# Patient Record
Sex: Female | Born: 1995 | Race: Black or African American | Hispanic: No | Marital: Single | Smoking: Never smoker
Health system: Southern US, Community
[De-identification: ages and names within clinical notes are randomized; demographics above are authoritative.]

## PROBLEM LIST (undated history)

## (undated) DIAGNOSIS — J45909 Unspecified asthma, uncomplicated: Secondary | ICD-10-CM

---
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
## 2019-03-07 ENCOUNTER — Other Ambulatory Visit: Payer: Self-pay

## 2019-03-07 ENCOUNTER — Emergency Department (HOSPITAL_COMMUNITY)
Admission: EM | Admit: 2019-03-07 | Discharge: 2019-03-08 | Disposition: A | Discharge status: Home / Self Care | Payer: Medicaid Other | Attending: Emergency Medicine | Admitting: Emergency Medicine

## 2019-03-07 ENCOUNTER — Encounter (HOSPITAL_COMMUNITY): Payer: Self-pay

## 2019-03-07 DIAGNOSIS — J45909 Unspecified asthma, uncomplicated: Secondary | ICD-10-CM | POA: Diagnosis not present

## 2019-03-07 DIAGNOSIS — Z79899 Other long term (current) drug therapy: Secondary | ICD-10-CM | POA: Diagnosis not present

## 2019-03-07 DIAGNOSIS — H5713 Ocular pain, bilateral: Secondary | ICD-10-CM | POA: Diagnosis present

## 2019-03-07 DIAGNOSIS — H1033 Unspecified acute conjunctivitis, bilateral: Secondary | ICD-10-CM

## 2019-03-07 MED ORDER — TETRACAINE HCL 0.5 % OP SOLN
1.0000 [drp] | Freq: Once | OPHTHALMIC | Status: AC
  Administered 2019-03-08: 2 [drp] via OPHTHALMIC
  Filled 2019-03-07: qty 4

## 2019-03-07 MED ORDER — FLUORESCEIN SODIUM 1 MG OP STRP
1.0000 | ORAL_STRIP | Freq: Once | OPHTHALMIC | Status: DC
  Filled 2019-03-07: qty 1

## 2019-03-07 NOTE — ED Notes (Signed)
PT CURRENTLY TEXTING ON PHONE

## 2019-03-07 NOTE — ED Triage Notes (Signed)
Pt reports 3 days of eye swelling and discomfort. States that when she wakes up, they are crusted closed. She denies any cold symptoms. Given drops at [facility] yesterday without improvement. A&Ox4.

## 2019-03-08 MED ORDER — ERYTHROMYCIN 5 MG/GM OP OINT
TOPICAL_OINTMENT | Freq: Once | OPHTHALMIC | Status: AC
Start: 2019-03-08 — End: 2019-03-08
  Administered 2019-03-08: 1 via OPHTHALMIC
  Filled 2019-03-08: qty 3.5

## 2019-03-08 NOTE — ED Provider Notes (Signed)
[facility]-EMERGENCY DEPT Provider Note   CSN: [id number] Arrival date & time: 03/07/19  1947     History   Chief Complaint Chief Complaint  Patient presents with   Eye Pain    HPI Doris Bridges is a 23 y.o. female.     Patient presents the emergency department with complaint of 3 days of eye redness, irritation, drainage, and crusting.  Patient denies any sick contacts.  She does not have any eye pain.  No loss of vision.  Blurry vision in the morning because of the crusting improved after wiping her eyes.  No fevers or cold symptoms.  Patient was seen in urgent care yesterday and given drops.  She is not sure what the name of the drops were.  She states that this has helped with the redness but not with the crusting.  No nausea or vomiting.  She noted a mild rash around her eyes today.  Onset of symptoms acute.  She does not wear contact lenses.  She denies any eye injuries.     Past Medical History:  Diagnosis Date   Asthma     There are no active problems to display for this patient.   History reviewed. No pertinent surgical history.   OB History   No obstetric history on file.      Home Medications    Prior to Admission medications   Medication Sig Start Date End Date Taking? Authorizing Provider  cetirizine (ZYRTEC) 10 MG tablet Take 10 mg by mouth daily as needed for allergies.    Yes [provider]  ibuprofen (ADVIL) 800 MG tablet Take 800 mg by mouth every 8 (eight) hours as needed for moderate pain.  03/05/19  Yes [provider]  Norgestimate-Ethinyl Estradiol Triphasic (TRI-LO-SPRINTEC) 0.18/0.215/0.25 MG-25 MCG tab Take 1 tablet by mouth daily.  01/10/19  Yes [provider]  trimethoprim-polymyxin b (POLYTRIM) ophthalmic solution Place 1 drop into both eyes every 4 (four) hours.  03/05/19 03/12/19 Yes [provider]    Family History History reviewed. No pertinent family history.  Social  History Social History   Tobacco Use   Smoking status: Never Smoker   Smokeless tobacco: Never Used  Substance Use Topics   Alcohol use: No   Drug use: Not on file     Allergies   Patient has no known allergies.   Review of Systems Review of Systems  Constitutional: Negative for fever.  Eyes: Positive for discharge, redness and itching. Negative for photophobia, pain and visual disturbance.  Neurological: Negative for headaches.     Physical Exam Updated Vital Signs BP (!) 142/73    Pulse 87    Temp 99.2 F (37.3 C) (Oral)    Resp 18    Ht 5\' 3"  (1.6 m)    Wt 124.7 kg    SpO2 94%    BMI 48.71 kg/m   Physical Exam Vitals signs and nursing note reviewed.  Constitutional:      Appearance: She is well-developed.  HENT:     Head: Normocephalic and atraumatic.  Eyes:     Extraocular Movements: Extraocular movements intact.     Conjunctiva/sclera:     Right eye: Right conjunctiva is injected. Exudate present. No chemosis or hemorrhage.    Left eye: Left conjunctiva is injected. Exudate present. No chemosis or hemorrhage.    Comments: Patient with a slight purulent discharge to the bilateral eyes.  There is a mild dermatitis-like rash around both eyes.  No  signs of periorbital cellulitis.  Conjunctiva mildly inflamed.   Neck:     Musculoskeletal: Normal range of motion and neck supple.  Pulmonary:     Effort: No respiratory distress.  Skin:    General: Skin is warm and dry.  Neurological:     Mental Status: She is alert.      ED Treatments / Results  Labs (all labs ordered are listed, but only abnormal results are displayed) Labs Reviewed - No data to display  EKG None  Radiology No results found.  Procedures Procedures (including critical care time)  Medications Ordered in ED Medications  fluorescein ophthalmic strip 1 strip (has no administration in time range)  tetracaine (PONTOCAINE) 0.5 % ophthalmic solution 1-2 drop (2 drops Both Eyes Given  03/08/19 0037)  erythromycin ophthalmic ointment (1 application Both Eyes Given 03/08/19 0037)     Initial Impression / Assessment and Plan / ED Course  I have reviewed the triage vital signs and the nursing notes.  Pertinent labs & imaging results that were available during my care of the patient were reviewed by me and considered in my medical decision making (see chart for details).        Patient seen and examined.    Vital signs reviewed and are as follows: BP (!) 142/73    Pulse 87    Temp 99.2 F (37.3 C) (Oral)    Resp 18    Ht 5\' 3"  (1.6 m)    Wt 124.7 kg    SpO2 94%    BMI 48.71 kg/m     Visual Acuity  Right Eye Distance: 20/50 Left Eye Distance: 20/50 Bilateral Distance: 20/50(Visual acuity screening completed without contacts or glasses as the patient is not prescribed for any.)  Right Eye Near:   Left Eye Near:    Bilateral Near:     Patient with clinical bilateral conjunctivitis.  Will change from drops to erythromycin ointment.  Instructed on its use.  Patient instructed to use Benadryl if she has any itching rash around her eye.  Encouraged return to the emergency department with change or loss of vision, fevers, pain around the eyes, difficulty with movement of her eyes.  She verbalizes understanding agrees with plan.   Final Clinical Impressions(s) / ED Diagnoses   Final diagnoses:  Acute conjunctivitis of both eyes, unspecified acute conjunctivitis type   Patient with drainage and crusting of her bilateral eyes for the past several days.  Exam is consistent with bacterial conjunctivitis.  No foreign bodies noted. No surrounding erythema, swelling, vision changes/loss suspicious for orbital or periorbital cellulitis. No signs of iritis. No signs of glaucoma. No symptoms of retinal detachment. No ophthalmologic emergency suspected.   ED Discharge Orders    None       Carlisle Cater, PA-C 03/08/19 8101    Varney Biles, MD 03/08/19 [phone]

## 2019-03-08 NOTE — Discharge Instructions (Signed)
Please read and follow all provided instructions.  Your diagnoses today include:  1. Acute conjunctivitis of both eyes, unspecified acute conjunctivitis type     Tests performed today include:  Visual acuity testing to check your vision  Vital signs. See below for your results today.   Medications prescribed:   Erythromycin  - antibiotic eye ointment  Use this medication as follows:  Apply 1/4" of the antibiotic ointment to affected eye up to 6 times a day while awake for 7 days  Take any prescribed medications only as directed.  Home care instructions:  Follow any educational materials contained in this packet. If you wear contact lenses, do not use them until your eye caregiver approves. Follow-up care is necessary to be sure the infection is healing if not completely resolved in 2-3 days. See your caregiver or eye specialist as suggested for followup.   If you have an eye infection, wash your hands often as this is very contagious and is easily spread from person to person.   Follow-up instructions: Please follow-up with your primary care doctor in the next 2-3 days for further evaluation of your symptoms.  Return instructions:   Please return to the Emergency Department if you experience worsening symptoms.   Please return immediately if you develop severe pain, pus drainage, new change in vision, or fever.  Please return if you have any other emergent concerns.  Additional Information:  Your vital signs today were: BP (!) 160/81    Pulse 89    Temp 99.2 F (37.3 C) (Oral)    Resp 17    Ht 5\' 3"  (1.6 m)    Wt 124.7 kg    SpO2 99%    BMI 48.71 kg/m  If your blood pressure (BP) was elevated above 135/85 this visit, please have this repeated by your doctor within one month. ---------------

## 2019-03-08 NOTE — ED Notes (Signed)
Pt was verbalized discharge instructions. Pt had no further questions at this time. NAD. 

## 2019-06-16 ENCOUNTER — Ambulatory Visit: Payer: Medicaid Other | Attending: Internal Medicine

## 2019-06-16 DIAGNOSIS — Z20822 Contact with and (suspected) exposure to covid-19: Secondary | ICD-10-CM

## 2019-06-17 LAB — NOVEL CORONAVIRUS, NAA: SARS-CoV-2, NAA: NOT DETECTED

## 2019-06-30 ENCOUNTER — Ambulatory Visit: Payer: Medicaid Other | Attending: Internal Medicine

## 2019-06-30 DIAGNOSIS — Z20822 Contact with and (suspected) exposure to covid-19: Secondary | ICD-10-CM

## 2019-07-01 LAB — NOVEL CORONAVIRUS, NAA: SARS-CoV-2, NAA: NOT DETECTED

## 2020-04-06 ENCOUNTER — Emergency Department (HOSPITAL_COMMUNITY)
Admission: EM | Admit: 2020-04-06 | Discharge: 2020-04-06 | Disposition: A | Discharge status: Home / Self Care | Payer: Medicaid Other | Attending: Emergency Medicine | Admitting: Emergency Medicine

## 2020-04-06 ENCOUNTER — Encounter (HOSPITAL_COMMUNITY): Payer: Self-pay

## 2020-04-06 DIAGNOSIS — R0789 Other chest pain: Secondary | ICD-10-CM

## 2020-04-06 DIAGNOSIS — J45909 Unspecified asthma, uncomplicated: Secondary | ICD-10-CM | POA: Diagnosis not present

## 2020-04-06 DIAGNOSIS — R519 Headache, unspecified: Secondary | ICD-10-CM | POA: Diagnosis not present

## 2020-04-06 DIAGNOSIS — R0602 Shortness of breath: Secondary | ICD-10-CM | POA: Diagnosis not present

## 2020-04-06 NOTE — ED Triage Notes (Signed)
CP/SOB and HA x 4 days. Worse today. Denies cardiac hx. Denies fever.

## 2020-04-06 NOTE — ED Provider Notes (Signed)
[facility]-EMERGENCY DEPT Provider Note   CSN: [id number] Arrival date & time: 04/06/20  1008     History Chief Complaint  Patient presents with  . Chest Pain    Doris Bridges is a 24 y.o. female.  The history is provided by the patient. No language interpreter was used.  Chest Pain    24 year old female with history of asthma presenting complaining of chest pressure.  Patient report for the past 3 to 4 days she has been having throbbing headache described as a bandlike sensation across her head and as well as having chest pressure.  Pressure is rated as 4 out of 10, persistent, with mild shortness of breath but more when she tries to take a deep breath.  No associated fever or chills no vision changes no runny nose sneezing or coughing no loss of taste or smell no nausea vomiting diarrhea or dysuria.  She denies any focal numbness or focal weakness.  She was having trouble sleeping last night and felt a bit fatigued today.  Sister does have complaints of some congestion but patient denies any other URI symptoms.  She did try taking NyQuil and Tylenol for symptoms.  She has not been vaccinated for COVID-19.  She does have history of anxiety depression but denies having any increased stress.  She denies any prior history of PE or DVT.  She does have family history of cardiac disease but no premature cardiac death.     Past Medical History:  Diagnosis Date  . Asthma     There are no problems to display for this patient.   History reviewed. No pertinent surgical history.   OB History   No obstetric history on file.     History reviewed. No pertinent family history.  Social History   Tobacco Use  . Smoking status: Never Smoker  . Smokeless tobacco: Never Used  Substance Use Topics  . Alcohol use: No  . Drug use: Not on file    Home Medications Prior to Admission medications   Medication Sig Start Date End Date Taking? Authorizing Provider    ibuprofen (ADVIL) 800 MG tablet Take 800 mg by mouth every 8 (eight) hours as needed for moderate pain.  03/05/19  Yes [provider]  predniSONE (DELTASONE) 20 MG tablet Take 40 mg by mouth daily. 03/30/20  Yes [provider]    Allergies    Patient has no known allergies.  Review of Systems   Review of Systems  Cardiovascular: Positive for chest pain.  All other systems reviewed and are negative.   Physical Exam Updated Vital Signs BP 136/79 (BP Location: Right Arm)   Pulse 63   Temp 98.2 F (36.8 C) (Oral)   SpO2 100%   Physical Exam Vitals and nursing note reviewed.  Constitutional:      General: She is not in acute distress.    Appearance: She is well-developed. She is obese.  HENT:     Head: Atraumatic.  Eyes:     Extraocular Movements: Extraocular movements intact.     Conjunctiva/sclera: Conjunctivae normal.     Pupils: Pupils are equal, round, and reactive to light.  Cardiovascular:     Rate and Rhythm: Normal rate and regular rhythm.     Heart sounds: Normal heart sounds.  Pulmonary:     Effort: Pulmonary effort is normal.     Breath sounds: Normal breath sounds.     Comments: No chest wall tenderness.  Lung sounds clear  to auscultation bilaterally. Chest:     Chest wall: No tenderness.  Abdominal:     Palpations: Abdomen is soft.     Tenderness: There is no abdominal tenderness.  Musculoskeletal:     Cervical back: Normal range of motion and neck supple.     Comments: 5 out of 5 strength all 4 extremities.  Lymphadenopathy:     Cervical: No cervical adenopathy.  Skin:    Findings: No rash.  Neurological:     Mental Status: She is alert and oriented to person, place, and time.     GCS: GCS eye subscore is 4. GCS verbal subscore is 5. GCS motor subscore is 6.     Cranial Nerves: Cranial nerves are intact.     Sensory: Sensation is intact.     Motor: Motor function is intact.     ED Results / Procedures / Treatments    Labs (all labs ordered are listed, but only abnormal results are displayed) Labs Reviewed - No data to display  EKG None  Radiology No results found.  Procedures Procedures (including critical care time)  Medications Ordered in ED Medications - No data to display  ED Course  I have reviewed the triage vital signs and the nursing notes.  Pertinent labs & imaging results that were available during my care of the patient were reviewed by me and considered in my medical decision making (see chart for details).    MDM Rules/Calculators/A&P                          BP 136/79 (BP Location: Right Arm)   Pulse 63   Temp 98.2 F (36.8 C) (Oral)   SpO2 100%   Final Clinical Impression(s) / ED Diagnoses Final diagnoses:  Atypical chest pain    Rx / DC Orders ED Discharge Orders    None     11:12 AM Patient here complains of headache and chest pain.  Symptom is mild on presentation.  No reproducible pain on exam.  Pain is atypical of ACS.  Headache without any red flags.  Patient is PERC negative, doubt PE.  EKG reviewed by me and unremarkable.  Through shared decision making, we do not think advanced imaging or additional work-up is indicated at this time.  She has not been vaccinated for COVID-19 but is made aware that if she does develop cold symptoms to get tested.  Otherwise at this time patient stable for discharge.  O2 sats at 100% and vital signs stable.  No wheezing on exam.  Remote history of asthma when she was young but has not needed to use inhaler for many years.  Return precaution discussed.   Fayrene Helper, PA-C 04/06/20 1127    Sabas Sous, MD 04/06/20 [phone]

## 2020-04-06 NOTE — Discharge Instructions (Signed)
Take over-the-counter Tylenol or ibuprofen as needed for your chest discomfort and headache.  If you develop runny nose sneezing coughing sore throat fever or chills you may consider getting tested for COVID-19.  Return to the ER if your condition worsen, if you have significant shortness of breath or if you have any concern

## 2020-11-01 ENCOUNTER — Emergency Department (HOSPITAL_BASED_OUTPATIENT_CLINIC_OR_DEPARTMENT_OTHER): Payer: Medicaid Other

## 2020-11-01 ENCOUNTER — Emergency Department (HOSPITAL_BASED_OUTPATIENT_CLINIC_OR_DEPARTMENT_OTHER)
Admission: EM | Admit: 2020-11-01 | Discharge: 2020-11-01 | Disposition: A | Discharge status: Home / Self Care | Payer: Medicaid Other | Attending: Emergency Medicine | Admitting: Emergency Medicine

## 2020-11-01 ENCOUNTER — Other Ambulatory Visit: Payer: Self-pay

## 2020-11-01 ENCOUNTER — Encounter (HOSPITAL_BASED_OUTPATIENT_CLINIC_OR_DEPARTMENT_OTHER): Payer: Self-pay | Admitting: Obstetrics and Gynecology

## 2020-11-01 DIAGNOSIS — J45909 Unspecified asthma, uncomplicated: Secondary | ICD-10-CM | POA: Diagnosis not present

## 2020-11-01 DIAGNOSIS — U071 COVID-19: Secondary | ICD-10-CM | POA: Insufficient documentation

## 2020-11-01 DIAGNOSIS — R0981 Nasal congestion: Secondary | ICD-10-CM | POA: Diagnosis present

## 2020-11-01 LAB — COMPREHENSIVE METABOLIC PANEL
ALT: 28 U/L (ref 0–44)
AST: 27 U/L (ref 15–41)
Albumin: 3.6 g/dL (ref 3.5–5.0)
Alkaline Phosphatase: 58 U/L (ref 38–126)
Anion gap: 9 (ref 5–15)
BUN: 8 mg/dL (ref 6–20)
CO2: 24 mmol/L (ref 22–32)
Calcium: 8.6 mg/dL — ABNORMAL LOW (ref 8.9–10.3)
Chloride: 105 mmol/L (ref 98–111)
Creatinine, Ser: 0.83 mg/dL (ref 0.44–1.00)
GFR, Estimated: >60 mL/min (ref >60)
Glucose, Bld: 152 mg/dL — ABNORMAL HIGH (ref 70–99)
Potassium: 3.5 mmol/L (ref 3.5–5.1)
Sodium: 138 mmol/L (ref 135–145)
Total Bilirubin: 0.2 mg/dL — ABNORMAL LOW (ref 0.3–1.2)
Total Protein: 6.4 g/dL — ABNORMAL LOW (ref 6.5–8.1)

## 2020-11-01 LAB — CBC WITH DIFFERENTIAL/PLATELET
Abs Immature Granulocytes: 0.04 10*3/uL (ref 0.00–0.07)
Basophils Absolute: 0 10*3/uL (ref 0.0–0.1)
Basophils Relative: 0 %
Eosinophils Absolute: 0.2 10*3/uL (ref 0.0–0.5)
Eosinophils Relative: 3 %
HCT: 36.7 % (ref 36.0–46.0)
Hemoglobin: 11.4 g/dL — ABNORMAL LOW (ref 12.0–15.0)
Immature Granulocytes: 1 %
Lymphocytes Relative: 5 %
Lymphs Abs: 0.3 10*3/uL — ABNORMAL LOW (ref 0.7–4.0)
MCH: 24.2 pg — ABNORMAL LOW (ref 26.0–34.0)
MCHC: 31.1 g/dL (ref 30.0–36.0)
MCV: 77.8 fL — ABNORMAL LOW (ref 80.0–100.0)
Monocytes Absolute: 0.3 10*3/uL (ref 0.1–1.0)
Monocytes Relative: 6 %
Neutro Abs: 5.3 10*3/uL (ref 1.7–7.7)
Neutrophils Relative %: 85 %
Platelets: 394 10*3/uL (ref 150–400)
RBC: 4.72 MIL/uL (ref 3.87–5.11)
RDW: 15.9 % — ABNORMAL HIGH (ref 11.5–15.5)
WBC: 6.2 10*3/uL (ref 4.0–10.5)
nRBC: 0 % (ref 0.0–0.2)

## 2020-11-01 LAB — URINALYSIS, ROUTINE W REFLEX MICROSCOPIC
Bilirubin Urine: NEGATIVE
Glucose, UA: NEGATIVE mg/dL
Hgb urine dipstick: NEGATIVE
Ketones, ur: NEGATIVE mg/dL
Leukocytes,Ua: NEGATIVE
Nitrite: NEGATIVE
Protein, ur: NEGATIVE mg/dL
Specific Gravity, Urine: 1.01 (ref 1.005–1.030)
pH: 6.5 (ref 5.0–8.0)

## 2020-11-01 LAB — RESP PANEL BY RT-PCR (FLU A&B, COVID) ARPGX2
Influenza A by PCR: NEGATIVE
Influenza B by PCR: NEGATIVE
SARS Coronavirus 2 by RT PCR: POSITIVE — AB

## 2020-11-01 MED ORDER — KETOROLAC TROMETHAMINE 30 MG/ML IJ SOLN
30.0000 mg | Freq: Once | INTRAMUSCULAR | Status: AC
  Administered 2020-11-01: 30 mg via INTRAVENOUS
  Filled 2020-11-01: qty 1

## 2020-11-01 MED ORDER — PAXLOVID 20 X 150 MG & 10 X 100MG PO TBPK: 1.0000 | ORAL_TABLET | Freq: Two times a day (BID) | ORAL | 0 refills | Status: AC

## 2020-11-01 MED ORDER — SODIUM CHLORIDE 0.9 % IV BOLUS
1000.0000 mL | Freq: Once | INTRAVENOUS | Status: AC
  Administered 2020-11-01: 1000 mL via INTRAVENOUS

## 2020-11-01 NOTE — Discharge Instructions (Signed)
You need to start the medication today. If your symptoms rebound after you finish the medication, you must quarantine again.

## 2020-11-01 NOTE — ED Triage Notes (Signed)
Patient reports nausea, headaches and chills. Denies sick exposures. Patient endorses vaccination against COVID 19. Patient denies pregnancy.

## 2020-11-01 NOTE — ED Notes (Signed)
Spoke with the patient and she stated that she tested herself this morning for Covid and the result is inconclusive.  She stated that the line for the covid test is faded and not clear.  They arrived from the [place] this past Sunday and was not feeling good.  Her mother is also here for the same complaint.

## 2020-11-01 NOTE — ED Provider Notes (Signed)
[facility] EMERGENCY DEPT Provider Note   CSN: [id number] Arrival date & time: 11/01/20  1535     History Chief Complaint  Patient presents with  . Headache    Doris Bridges is a 25 y.o. female.  The history is provided by the patient.  URI Presenting symptoms: congestion, cough and sore throat   Presenting symptoms: no ear pain and no fever   Severity:  Moderate Onset quality:  Gradual Duration:  1 day Timing:  Intermittent Progression:  Unchanged Chronicity:  New Relieved by:  Nothing Worsened by:  Nothing Ineffective treatments:  None tried Associated symptoms: headaches   Associated symptoms: no arthralgias   Risk factors: recent travel   Risk factors comment:  Just went on a cruise to the [place]; vaccinated for COVID x 2 but not boosted      Past Medical History:  Diagnosis Date  . Asthma     There are no problems to display for this patient.   History reviewed. No pertinent surgical history.   OB History    Gravida      Para      Term      Preterm      AB      Living  0     SAB      IAB      Ectopic      Multiple      Live Births              No family history on file.  Social History   Tobacco Use  . Smoking status: Never Smoker  . Smokeless tobacco: Never Used  Vaping Use  . Vaping Use: Every day  . Substances: Nicotine, Flavoring  Substance Use Topics  . Alcohol use: No  . Drug use: Not Currently    Home Medications Prior to Admission medications   Medication Sig Start Date End Date Taking? Authorizing Provider  ibuprofen (ADVIL) 800 MG tablet Take 800 mg by mouth every 8 (eight) hours as needed for moderate pain.  03/05/19  Yes [provider]  predniSONE (DELTASONE) 20 MG tablet Take 40 mg by mouth daily. 03/30/20   [provider]    Allergies    Patient has no known allergies.  Review of Systems   Review of Systems  Constitutional: Negative for chills and fever.   HENT: Positive for congestion and sore throat. Negative for ear pain.   Eyes: Negative for pain and visual disturbance.  Respiratory: Positive for cough. Negative for shortness of breath.   Cardiovascular: Negative for chest pain and palpitations.  Gastrointestinal: Positive for nausea. Negative for abdominal pain and vomiting.  Genitourinary: Negative for dysuria and hematuria.  Musculoskeletal: Negative for arthralgias and back pain.  Skin: Negative for color change and rash.  Neurological: Positive for headaches. Negative for seizures and syncope.  All other systems reviewed and are negative.   Physical Exam Updated Vital Signs BP 130/88   Pulse (!) 145   Temp 99.3 F (37.4 C)   Resp 17   Ht 5\' 3"  (1.6 m)   Wt 131.5 kg   LMP 10/25/2020   SpO2 94%   BMI 51.37 kg/m   Physical Exam Vitals and nursing note reviewed.  HENT:     Head: Normocephalic and atraumatic.  Eyes:     General: No scleral icterus. Pulmonary:     Effort: Pulmonary effort is normal. No respiratory distress.  Musculoskeletal:     Cervical back: Normal range  of motion.  Skin:    General: Skin is warm and dry.  Neurological:     Mental Status: She is alert.  Psychiatric:        Mood and Affect: Mood normal.     ED Results / Procedures / Treatments   Labs (all labs ordered are listed, but only abnormal results are displayed) Labs Reviewed  RESP PANEL BY RT-PCR (FLU A&B, COVID) ARPGX2 - Abnormal; Notable for the following components:      Result Value   SARS Coronavirus 2 by RT PCR POSITIVE (*)    All other components within normal limits  CBC WITH DIFFERENTIAL/PLATELET - Abnormal; Notable for the following components:   Hemoglobin 11.4 (*)    MCV 77.8 (*)    MCH 24.2 (*)    RDW 15.9 (*)    Lymphs Abs 0.3 (*)    All other components within normal limits  COMPREHENSIVE METABOLIC PANEL - Abnormal; Notable for the following components:   Glucose, Bld 152 (*)    Calcium 8.6 (*)    Total  Protein 6.4 (*)    Total Bilirubin 0.2 (*)    All other components within normal limits  URINALYSIS, ROUTINE W REFLEX MICROSCOPIC    EKG EKG Interpretation  Date/Time:  Monday Nov 01 2020 15:47:20 EDT Ventricular Rate:  125 PR Interval:  132 QRS Duration: 86 QT Interval:  314 QTC Calculation: 453 R Axis:   66 Text Interpretation: Sinus tachycardia Nonspecific T abnormalities, diffuse leads Baseline wander in lead(s) V2 axis normal No acute ischemia Confirmed by Pieter Partridge ([id number]) on 11/01/2020 3:49:47 PM   Radiology DG Chest Portable 1 View  Result Date: 11/01/2020 CLINICAL DATA:  Chest pain.  Coughing. EXAM: PORTABLE CHEST 1 VIEW COMPARISON:  None. FINDINGS: Cardiomediastinal silhouette is normal. Mediastinal contours appear intact. Minimal ground-glass peribronchial opacities with lower lobe predominance. Osseous structures are without acute abnormality. Soft tissues are grossly normal. IMPRESSION: Minimal ground-glass peribronchial opacities with lower lobe predominance may represent atypical/viral pneumonia. Electronically Signed   By: Ted Mcalpine M.D.   On: 11/01/2020 16:24    Procedures Procedures   Medications Ordered in ED Medications  sodium chloride 0.9 % bolus 1,000 mL (0 mLs Intravenous Stopped 11/01/20 1736)  ketorolac (TORADOL) 30 MG/ML injection 30 mg (30 mg Intravenous Given 11/01/20 1626)    ED Course  I have reviewed the triage vital signs and the nursing notes.  Pertinent labs & imaging results that were available during my care of the patient were reviewed by me and considered in my medical decision making (see chart for details).    MDM Rules/Calculators/A&P                          Doris Bridges presents with viral symptoms.  She just returned from a cruise in the [place].  She was noted to be tachycardic.  She was given IV fluids.  She was given symptomatic treatment for her headache and nausea.  COVID test was positive.  I talked to her about  PACs lipid.  Her main risk factor is her body mass index.  We are already seeing pneumonia on chest x-ray, and I think it would be a good idea for her to start this medication.  We talked about its appropriate use including the need to start it immediately.  We also talked about the rebound phenomenon that could occur.  She was advised on other return precautions. Final Clinical Impression(s) /  ED Diagnoses Final diagnoses:  COVID-19    Rx / DC Orders ED Discharge Orders         Ordered    Nirmatrelvir & Ritonavir (PAXLOVID) 20 x 150 MG & 10 x 100MG  TBPK  2 times daily        11/01/20 1750           11/03/20, MD 11/01/20 1752

## 2022-01-18 ENCOUNTER — Emergency Department (HOSPITAL_BASED_OUTPATIENT_CLINIC_OR_DEPARTMENT_OTHER): Payer: Medicaid Other | Admitting: Radiology

## 2022-01-18 ENCOUNTER — Encounter (HOSPITAL_BASED_OUTPATIENT_CLINIC_OR_DEPARTMENT_OTHER): Payer: Self-pay

## 2022-01-18 ENCOUNTER — Emergency Department (HOSPITAL_BASED_OUTPATIENT_CLINIC_OR_DEPARTMENT_OTHER)
Admission: EM | Admit: 2022-01-18 | Discharge: 2022-01-18 | Disposition: A | Discharge status: Home / Self Care | Payer: Medicaid Other | Attending: Emergency Medicine | Admitting: Emergency Medicine

## 2022-01-18 DIAGNOSIS — M25571 Pain in right ankle and joints of right foot: Secondary | ICD-10-CM | POA: Diagnosis present

## 2022-01-18 MED ORDER — ACETAMINOPHEN 325 MG PO TABS
ORAL_TABLET | ORAL | Status: AC
  Filled 2022-01-18: qty 1

## 2022-01-18 MED ORDER — ACETAMINOPHEN 325 MG PO TABS
650.0000 mg | ORAL_TABLET | ORAL | Status: AC
  Administered 2022-01-18: 650 mg via ORAL
  Filled 2022-01-18: qty 2

## 2022-01-18 NOTE — ED Notes (Signed)
Discharge paperwork given and verbally understood. 

## 2022-01-18 NOTE — ED Triage Notes (Signed)
She states she "tripped" and thereby hurt her right ankle. She is in no distress.

## 2022-01-18 NOTE — ED Provider Notes (Signed)
[facility] EMERGENCY DEPT Provider Note   CSN: [id number] Arrival date & time: 01/18/22  1251     History Chief Complaint  Patient presents with   Ankle Pain    Doris Bridges is a 26 y.o. female otherwise healthy presents to the ED for evaluation of her right ankle pain after stepping off a step wrong yesterday. She reports that last week she rolled her ankle in an uncomfortable pair of shoes as well. She denies any numbness, tingling, fevers, or weakness. No pain medications trialed. Denies any daily medications or allergies to medications. LMP one week ago.    Ankle Pain Associated symptoms: no back pain and no fever        Home Medications Prior to Admission medications   Medication Sig Start Date End Date Taking? Authorizing Provider  ibuprofen (ADVIL) 800 MG tablet Take 800 mg by mouth every 8 (eight) hours as needed for moderate pain.  03/05/19   [provider]  predniSONE (DELTASONE) 20 MG tablet Take 40 mg by mouth daily. 03/30/20   [provider]      Allergies    Patient has no known allergies.    Review of Systems   Review of Systems  Constitutional:  Negative for chills and fever.  Musculoskeletal:  Positive for arthralgias. Negative for back pain.  Neurological:  Negative for weakness and numbness.    Physical Exam Updated Vital Signs BP (!) 104/58 (BP Location: Right Arm)   Pulse 73   Temp 97.8 F (36.6 C) (Oral)   Resp 16   LMP 01/11/2022 (Exact Date)   SpO2 100%  Physical Exam Vitals and nursing note reviewed.  Constitutional:      Appearance: Normal appearance.  Eyes:     General: No scleral icterus. Pulmonary:     Effort: Pulmonary effort is normal. No respiratory distress.  Musculoskeletal:        General: Tenderness present.     Comments: RLE - Coloration and temperature appear in feel symmetric.  Palpable DP and PT pulses.  Compartments are soft.  Full range of motion of ankle with and without  resistance, but with some pain. Tenderness to the inferior lateral malleous.  Cap refill brisk. No swelling noted.   Skin:    General: Skin is dry.     Findings: No rash.  Neurological:     General: No focal deficit present.     Mental Status: She is alert. Mental status is at baseline.  Psychiatric:        Mood and Affect: Mood normal.     ED Results / Procedures / Treatments   Labs (all labs ordered are listed, but only abnormal results are displayed) Labs Reviewed - No data to display  EKG None  Radiology DG Ankle Complete Right  Result Date: 01/18/2022 CLINICAL DATA:  Fall yesterday.  Ankle pain EXAM: RIGHT ANKLE - COMPLETE 3+ VIEW COMPARISON:  None Available. FINDINGS: There is no evidence of fracture, dislocation, or joint effusion. There is no evidence of arthropathy or other focal bone abnormality. Soft tissues are unremarkable. IMPRESSION: Negative. Electronically Signed   By: Marlan Palau M.D.   On: 01/18/2022 13:35    Procedures Procedures    Medications Ordered in ED Medications - No data to display  ED Course/ Medical Decision Making/ A&P                           Medical Decision Making Amount  and/or Complexity of Data Reviewed Radiology: ordered.    26 year old female presents the emergency room for evaluation of ankle pain after stepping off a step wrong and also while rolling her ankle last week.  Differential diagnosis includes but is not limited to septic arthritis, sprain, strain, dislocation, fracture, MSK.  Vital signs are unremarkable.  Physical exam as noted above.  X-ray right ankle shows no evidence of any fracture, dislocation, joint effusion.  There is no evidence of any arthropathy or any other focal bone abnormality.  Soft tissue is unremarkable.  Doubt any septic arthritis given the normal vital signs, injury, and lack of swelling or overlying erythema.  Patient is also weightbearing and has full range of motion.  No dislocation or  fracture seen of the x-ray of the ankle.  We will give the patient some Tylenol while here as I do not know her pregnancy status although she did have LMP last week ago.  Patient is okay with this.  We will place patient in a ankle brace and have her follow-up with sports medicine.  Discussed taking Tylenol or ibuprofen as needed for pain.  We discussed the RICE method.  We discussed return precautions and red flag symptoms.  Patient verbalized understanding and agrees to the plan.  Patient is stable and being discharged home in good condition.  Final Clinical Impression(s) / ED Diagnoses Final diagnoses:  Acute right ankle pain    Rx / DC Orders ED Discharge Orders     None         Achille Rich, PA-C 01/18/22 1702    Charlynne Pander, MD 01/20/22 [phone]

## 2022-01-18 NOTE — Discharge Instructions (Signed)
You were seen in the emergency department for evaluation of your ankle pain.  Your x-ray was unremarkable.  You may have some tendon or ligament injury or damage.  I have placed you in a brace to wear when you are up walking.  You can take this off when you are sleeping or at rest.  I have included information on the RICE method as well.  Please read.  Can take Tylenol 1000 mg and/or ibuprofen 600mg  every 6 hours as needed for pain.  Please do not take ibuprofen if concern for pregnancy.  I have included information for a sports medicine provider for you to call to schedule an appointment with.  If you have any concerns, new or worsening symptoms, please return to the nearest emergency department for evaluation.  Contact a health care provider if: Your pain gets worse. Your pain is not relieved with medicines. You have a fever or chills. You are having more trouble with walking. You have new symptoms. Get help right away if: Your foot, leg, toes, or ankle: Tingles or becomes numb. Becomes swollen. Turns pale or blue.

## 2022-01-18 NOTE — ED Notes (Signed)
Pt still had PMS in foot after brace applied. No additional pain.

## 2022-01-18 NOTE — ED Notes (Signed)
Third Tylenol 325mg  pulled from the pixus, Pt dropped one of the original tablets.

## 2022-02-01 ENCOUNTER — Ambulatory Visit: Payer: Medicaid Other | Admitting: Family Medicine

## 2022-02-08 ENCOUNTER — Ambulatory Visit (HOSPITAL_BASED_OUTPATIENT_CLINIC_OR_DEPARTMENT_OTHER)
Admission: RE | Admit: 2022-02-08 | Discharge: 2022-02-08 | Disposition: A | Discharge status: Home / Self Care | Payer: Medicaid Other | Source: Ambulatory Visit | Attending: Family Medicine | Admitting: Family Medicine

## 2022-02-08 ENCOUNTER — Ambulatory Visit (INDEPENDENT_AMBULATORY_CARE_PROVIDER_SITE_OTHER): Payer: Medicaid Other | Admitting: Family Medicine

## 2022-02-08 ENCOUNTER — Ambulatory Visit: Payer: Self-pay

## 2022-02-08 VITALS — BP 152/84 | Ht 63.0 in | Wt 280.0 lb

## 2022-02-08 DIAGNOSIS — M79671 Pain in right foot: Secondary | ICD-10-CM

## 2022-02-08 DIAGNOSIS — S92214A Nondisplaced fracture of cuboid bone of right foot, initial encounter for closed fracture: Secondary | ICD-10-CM | POA: Insufficient documentation

## 2022-02-08 NOTE — Assessment & Plan Note (Signed)
Acutely occurring.  Initial injury on 8/16.  Symptoms more consistent with a fracture at this point. -Counseled on home exercise therapy and supportive care. -xray  -Cam walker. - f/u in 3 weeks

## 2022-02-08 NOTE — Progress Notes (Signed)
  JAILEY BOOTON - 26 y.o. female MRN [id number]  Date of birth: 26-Sep-1995  SUBJECTIVE:  Including CC & ROS.  No chief complaint on file.   Allia AMAND LEMOINE is a 26 y.o. female that is  here for right foot pain. Having ongoing pain after her inversion injury. No pain at the ankle.  Review of the emergency department note from 8/16 shows she was counseled on supportive care. Independent review of of the right ankle x-ray from 8/16 status no acute changes.  Review of Systems See HPI   HISTORY: Past Medical, Surgical, Social, and Family History Reviewed & Updated per EMR.   Pertinent Historical Findings include:  Past Medical History:  Diagnosis Date   Asthma     No past surgical history on file.   PHYSICAL EXAM:  VS: BP (!) 152/84   Ht 5\' 3"  (1.6 m)   Wt 280 lb (127 kg)   LMP 01/11/2022 (Exact Date)   BMI 49.60 kg/m  Physical Exam Gen: NAD, alert, cooperative with exam, well-appearing MSK: Neurovascularly intact    Limited ultrasound: right foot:  Normal appearing peroneal tendons.  Normal insertion of peroneal brevis at the base of the 5th MT  Increased hyperemia at the cuboid with cortex change    Summary: findings consistent with fracture of the cuboid  Ultrasound and interpretation by 03/13/2022, MD    ASSESSMENT & PLAN:   Closed nondisplaced fracture of cuboid bone of right foot Acutely occurring.  Initial injury on 8/16.  Symptoms more consistent with a fracture at this point. -Counseled on home exercise therapy and supportive care. -xray  -Cam walker. - f/u in 3 weeks

## 2022-02-08 NOTE — Patient Instructions (Signed)
Nice to meet you Please use ice as needed  I will call with the xray results.   Please send me a message in MyChart with any questions or updates.  Please see me back in 3 weeks.   --Dr. Jordan Likes

## 2022-02-09 ENCOUNTER — Telehealth: Payer: Self-pay | Admitting: Family Medicine

## 2022-02-09 NOTE — Telephone Encounter (Signed)
Informed of results.   Myra Rude, MD [facility] 02/09/2022, 1:41 PM

## 2022-03-01 ENCOUNTER — Encounter: Payer: Self-pay | Admitting: Family Medicine

## 2022-03-01 ENCOUNTER — Ambulatory Visit (INDEPENDENT_AMBULATORY_CARE_PROVIDER_SITE_OTHER): Payer: Medicaid Other | Admitting: Family Medicine

## 2022-03-01 VITALS — BP 131/83 | Ht 63.0 in | Wt 280.0 lb

## 2022-03-01 DIAGNOSIS — S92214D Nondisplaced fracture of cuboid bone of right foot, subsequent encounter for fracture with routine healing: Secondary | ICD-10-CM

## 2022-03-01 NOTE — Progress Notes (Signed)
  Doris Bridges - 26 y.o. female MRN [id number]  Date of birth: Feb 10, 1996  SUBJECTIVE:  Including CC & ROS.  No chief complaint on file.   Doris Bridges is a 26 y.o. female that is following up for her right foot pain.  She has no pain over the cuboid.  No longer having swelling.  She has been using the cam walker.    Review of Systems See HPI   HISTORY: Past Medical, Surgical, Social, and Family History Reviewed & Updated per EMR.   Pertinent Historical Findings include:  Past Medical History:  Diagnosis Date   Asthma     History reviewed. No pertinent surgical history.   PHYSICAL EXAM:  VS: BP 131/83 (BP Location: Right Arm, Patient Position: Sitting)   Ht 5\' 3"  (1.6 m)   Wt 280 lb (127 kg)   BMI 49.60 kg/m  Physical Exam Gen: NAD, alert, cooperative with exam, well-appearing MSK:  Neurovascularly intact       ASSESSMENT & PLAN:   Closed nondisplaced fracture of cuboid bone of right foot Initial injury occurring on 8/16.  Has had improvement over the cuboid.  Minor pain Lisfranc joint. -Counseled on home exercise therapy and supportive care. -Counseled on weaning out of the cam walker. -Consider further imaging or physical therapy. -Provided work note.

## 2022-03-01 NOTE — Assessment & Plan Note (Signed)
Initial injury occurring on 8/16.  Has had improvement over the cuboid.  Minor pain Lisfranc joint. -Counseled on home exercise therapy and supportive care. -Counseled on weaning out of the cam walker. -Consider further imaging or physical therapy. -Provided work note.

## 2022-03-01 NOTE — Patient Instructions (Addendum)
Good to see you Please try to wean out of the boot   Please try the exercises  Please let me know if the pain returns  Please send me a message in MyChart with any questions or updates.  Please see me back as needed.   --Dr. Raeford Razor

## 2022-03-02 IMAGING — DX DG CHEST 1V PORT
1 series · 1 of 1 positions shown · non-contrast
Comparison: None.

CLINICAL DATA: Chest pain.  Coughing.

EXAM:
PORTABLE CHEST 1 VIEW

[chest]
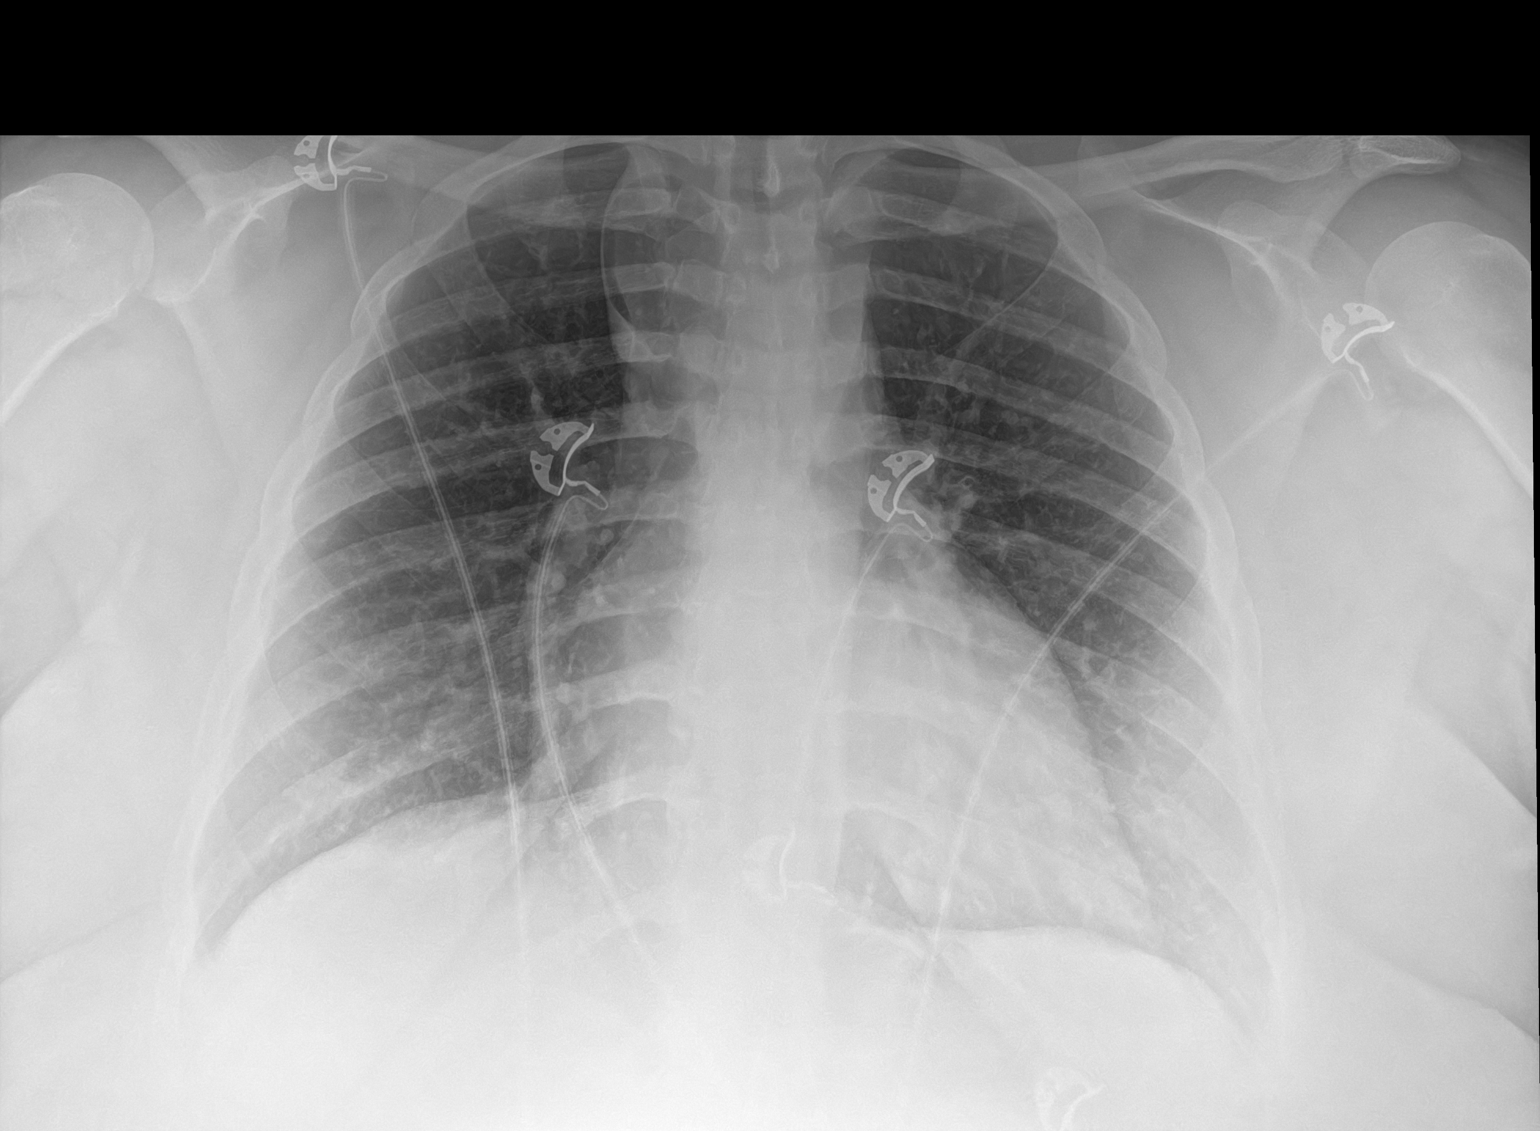

[1 of 1 positions shown; findings below may reference images not displayed]

FINDINGS: Cardiomediastinal silhouette is normal. Mediastinal contours appear
intact.

Minimal ground-glass peribronchial opacities with lower lobe
predominance.

Osseous structures are without acute abnormality. Soft tissues are
grossly normal.
IMPRESSION: Minimal ground-glass peribronchial opacities with lower lobe
predominance may represent atypical/viral pneumonia.

## 2022-07-05 ENCOUNTER — Emergency Department (HOSPITAL_BASED_OUTPATIENT_CLINIC_OR_DEPARTMENT_OTHER)
Admission: EM | Admit: 2022-07-05 | Discharge: 2022-07-05 | Disposition: A | Discharge status: Home / Self Care | Payer: Medicaid Other | Attending: Emergency Medicine | Admitting: Emergency Medicine

## 2022-07-05 ENCOUNTER — Other Ambulatory Visit: Payer: Self-pay

## 2022-07-05 ENCOUNTER — Encounter (HOSPITAL_BASED_OUTPATIENT_CLINIC_OR_DEPARTMENT_OTHER): Payer: Self-pay | Admitting: Radiology

## 2022-07-05 DIAGNOSIS — Z1152 Encounter for screening for COVID-19: Secondary | ICD-10-CM | POA: Insufficient documentation

## 2022-07-05 DIAGNOSIS — B974 Respiratory syncytial virus as the cause of diseases classified elsewhere: Secondary | ICD-10-CM | POA: Diagnosis not present

## 2022-07-05 DIAGNOSIS — B338 Other specified viral diseases: Secondary | ICD-10-CM

## 2022-07-05 DIAGNOSIS — R519 Headache, unspecified: Secondary | ICD-10-CM | POA: Insufficient documentation

## 2022-07-05 DIAGNOSIS — J45909 Unspecified asthma, uncomplicated: Secondary | ICD-10-CM | POA: Diagnosis not present

## 2022-07-05 LAB — GROUP A STREP BY PCR: Group A Strep by PCR: NOT DETECTED

## 2022-07-05 LAB — RESP PANEL BY RT-PCR (RSV, FLU A&B, COVID)  RVPGX2
Influenza A by PCR: NEGATIVE
Influenza B by PCR: NEGATIVE
Resp Syncytial Virus by PCR: POSITIVE — AB
SARS Coronavirus 2 by RT PCR: NEGATIVE

## 2022-07-05 MED ORDER — LIDOCAINE VISCOUS HCL 2 % MT SOLN
15.0000 mL | Freq: Once | OROMUCOSAL | Status: AC
  Administered 2022-07-05: 15 mL via OROMUCOSAL
  Filled 2022-07-05: qty 15

## 2022-07-05 MED ORDER — IBUPROFEN 800 MG PO TABS
800.0000 mg | ORAL_TABLET | Freq: Once | ORAL | Status: AC
  Administered 2022-07-05: 800 mg via ORAL
  Filled 2022-07-05: qty 1

## 2022-07-05 NOTE — ED Triage Notes (Signed)
Sore throat that started yesterday. Pt endorses headache  as well as runny nose and cough today.

## 2022-07-05 NOTE — Discharge Instructions (Addendum)
Recommend Tylenol, Ibuprofen, salt water gargles for sore throat. Continue to push oral fluids and the virus should run its course.

## 2022-07-05 NOTE — ED Provider Notes (Signed)
[facility] Provider Note   CSN: [id number] Arrival date & time: 07/05/22  1629     History  Chief Complaint  Patient presents with   Sore Throat   Headache    Doris Bridges is a 27 y.o. female.   Sore Throat Associated symptoms include headaches.  Headache Associated symptoms: sore throat      27 year old female presenting to the emergency department with medical history significant for asthma who presents with a sore throat and runny nose that started today.  She endorses a nonproductive cough as well.  No fevers.  No ear pain.  She is tolerating oral intake.  She is overall well-appearing.  She endorses a mild headache.  She has tried Tylenol at home.  Home Medications Prior to Admission medications   Medication Sig Start Date End Date Taking? Authorizing Provider  ibuprofen (ADVIL) 800 MG tablet Take 800 mg by mouth every 8 (eight) hours as needed for moderate pain.  03/05/19   [provider]  predniSONE (DELTASONE) 20 MG tablet Take 40 mg by mouth daily. 03/30/20   [provider]      Allergies    Patient has no known allergies.    Review of Systems   Review of Systems  HENT:  Positive for sore throat.   Neurological:  Positive for headaches.    Physical Exam Updated Vital Signs BP 134/62 (BP Location: Right Arm)   Pulse 95   Temp 98.9 F (37.2 C) (Oral)   Resp 18   Ht 5\' 3"  (1.6 m)   Wt 127 kg   SpO2 98%   BMI 49.60 kg/m  Physical Exam Vitals and nursing note reviewed.  Constitutional:      General: She is not in acute distress.    Appearance: She is well-developed.  HENT:     Head: Normocephalic and atraumatic.     Mouth/Throat:     Pharynx: Posterior oropharyngeal erythema present.     Tonsils: Tonsillar exudate present. No tonsillar abscesses. 1+ on the right.  Eyes:     Conjunctiva/sclera: Conjunctivae normal.  Cardiovascular:     Rate and Rhythm: Normal rate and regular  rhythm.     Heart sounds: No murmur heard. Pulmonary:     Effort: Pulmonary effort is normal. No respiratory distress.     Breath sounds: Normal breath sounds.  Abdominal:     Palpations: Abdomen is soft.     Tenderness: There is no abdominal tenderness.  Musculoskeletal:        General: No swelling.     Cervical back: Neck supple.  Skin:    General: Skin is warm and dry.     Capillary Refill: Capillary refill takes less than 2 seconds.  Neurological:     Mental Status: She is alert.  Psychiatric:        Mood and Affect: Mood normal.     ED Results / Procedures / Treatments   Labs (all labs ordered are listed, but only abnormal results are displayed) Labs Reviewed  RESP PANEL BY RT-PCR (RSV, FLU A&B, COVID)  [id number] - Abnormal; Notable for the following components:      Result Value   Resp Syncytial Virus by PCR POSITIVE (*)    All other components within normal limits  GROUP A STREP BY PCR    EKG None  Radiology No results found.  Procedures Procedures    Medications Ordered in ED Medications  lidocaine (XYLOCAINE) 2 % viscous  mouth solution 15 mL (15 mLs Mouth/Throat Given 07/05/22 1741)  ibuprofen (ADVIL) tablet 800 mg (800 mg Oral Given 07/05/22 1741)    ED Course/ Medical Decision Making/ A&P Clinical Course as of 07/05/22 1751  Wed Jul 05, 2022  1749 Respiratory Syncytial Virus by PCR(!): POSITIVE [JL]    Clinical Course User Index [JL] Regan Lemming, MD                             Medical Decision Making Amount and/or Complexity of Data Reviewed Labs:  Decision-making details documented in ED Course.  Risk Prescription drug management.    27 year old female presenting to the emergency department with medical history significant for asthma who presents with a sore throat and runny nose that started today.  She endorses a nonproductive cough as well.  No fevers.  No ear pain.  She is tolerating oral intake.  She is overall well-appearing.  She  endorses a mild headache.  She has tried Tylenol at home.  On arrival, the patient was afebrile, not tachycardic or tachypneic, hemodynamically stable, saturating well on room air.  Sinus rhythm noted on cardiac telemetry.  Physical exam significant for tonsillar erythema and exudate present bilaterally.  Good range of motion of the neck, no unilateral nature, low concern for PTA, RPA.  No evidence for Ludwig's Angina.  Symptoms are consistent with either viral URI, strep pharyngitis.  Strep screen PCR testing obtained in addition to COVID-19 and influenza PCR testing.  The patient was administered Motrin and viscous lidocaine.  The patient's testing resulted negative for strep, COVID and flu and was positive for RSV.  Informed the patient of her diagnosis of a viral URI.  Advised symptomatic management with Tylenol, ibuprofen, salt water gargles for sore throat.  Stable for discharge, return precautions provided.  Final Clinical Impression(s) / ED Diagnoses Final diagnoses:  RSV (respiratory syncytial virus infection)    Rx / DC Orders ED Discharge Orders     None         Regan Lemming, MD 07/05/22 1752

## 2022-09-18 ENCOUNTER — Encounter: Payer: Self-pay | Admitting: *Deleted

## 2024-06-30 ENCOUNTER — Encounter: Admitting: Obstetrics and Gynecology

## 2024-09-01 ENCOUNTER — Encounter: Admitting: Obstetrics and Gynecology
